# Patient Record
Sex: Male | Born: 2005 | Race: White | Hispanic: No | Marital: Single | Smoking: Never smoker
Health system: Southern US, Community
[De-identification: ages and names within clinical notes are randomized; demographics above are authoritative.]

## PROBLEM LIST (undated history)

## (undated) ENCOUNTER — Ambulatory Visit: Admission: EM | Payer: Managed Care, Other (non HMO) | Source: Home / Self Care

## (undated) DIAGNOSIS — K4090 Unilateral inguinal hernia, without obstruction or gangrene, not specified as recurrent: Secondary | ICD-10-CM

## (undated) DIAGNOSIS — J45909 Unspecified asthma, uncomplicated: Secondary | ICD-10-CM

## (undated) HISTORY — PX: HERNIA REPAIR: SHX51

---
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
## 2023-05-24 ENCOUNTER — Ambulatory Visit (INDEPENDENT_AMBULATORY_CARE_PROVIDER_SITE_OTHER): Payer: Managed Care, Other (non HMO) | Admitting: Podiatry

## 2023-05-24 ENCOUNTER — Encounter: Payer: Self-pay | Admitting: Podiatry

## 2023-05-24 DIAGNOSIS — L600 Ingrowing nail: Secondary | ICD-10-CM | POA: Diagnosis not present

## 2023-05-24 NOTE — Progress Notes (Signed)
Subjective:  Patient ID: Frederick Bass, male    DOB: 22-Aug-2006,  MRN: [id number]  Chief Complaint  Patient presents with   Ingrown Toenail    Right hallux ingrown     17 y.o. male presents with the above complaint.  Patient presents with right lateral border ingrown painful to touch is progressive gotten worse worse with ambulation is with pressure pain scale 7 out of 10 dull aching nature he would like to have removed he has not seen MRIs prior to seeing me.  Denies any other acute complaints   Review of Systems: Negative except as noted in the HPI. Denies N/V/F/Ch.  Past Medical History:  Diagnosis Date   Asthma    Hernia, inguinal     Current Outpatient Medications:    amitriptyline (ELAVIL) 10 MG tablet, Take 1 tablet by mouth at bedtime., Disp: , Rfl:    albuterol (PROVENTIL HFA;VENTOLIN HFA) 108 (90 BASE) MCG/ACT inhaler, Inhale 2 puffs into the lungs every 6 (six) hours as needed for wheezing or shortness of breath., Disp: 1 Inhaler, Rfl: 0   benzonatate (TESSALON) 100 MG capsule, Take 2 capsules (200 mg total) by mouth every 8 (eight) hours., Disp: 21 capsule, Rfl: 0   Cephalexin 500 MG tablet, cephalexin 500 mg tablet  Take 1 tablet every 6 hours by oral route for 5 days., Disp: , Rfl:    ibuprofen (ADVIL) 600 MG tablet, Take 1 tablet (600 mg total) by mouth every 8 (eight) hours as needed., Disp: 30 tablet, Rfl: 0   ipratropium (ATROVENT) 0.06 % nasal spray, Place 2 sprays into both nostrils 4 (four) times daily., Disp: 15 mL, Rfl: 12   promethazine-dextromethorphan (PROMETHAZINE-DM) 6.25-15 MG/5ML syrup, Take 5 mLs by mouth 4 (four) times daily as needed., Disp: 118 mL, Rfl: 0  Social History   Tobacco Use  Smoking Status Never  Smokeless Tobacco Never    No Known Allergies Objective:  There were no vitals filed for this visit. There is no height or weight on file to calculate BMI. Constitutional Well developed. Well nourished.  Vascular Dorsalis pedis pulses  palpable bilaterally. Posterior tibial pulses palpable bilaterally. Capillary refill normal to all digits.  No cyanosis or clubbing noted. Pedal hair growth normal.  Neurologic Normal speech. Oriented to person, place, and time. Epicritic sensation to light touch grossly present bilaterally.  Dermatologic Painful ingrowing nail at lateral nail borders of the hallux nail right. No other open wounds. No skin lesions.  Orthopedic: Normal joint ROM without pain or crepitus bilaterally. No visible deformities. No bony tenderness.   Radiographs: None Assessment:   1. Ingrown toenail of right foot    Plan:  Patient was evaluated and treated and all questions answered.  Ingrown Nail, right -Patient elects to proceed with minor surgery to remove ingrown toenail removal today. Consent reviewed and signed by patient. -Ingrown nail excised. See procedure note. -Educated on post-procedure care including soaking. Written instructions provided and reviewed. -Patient to follow up in 2 weeks for nail check.  Procedure: Excision of Ingrown Toenail Location: Right 1st toe lateral nail borders. Anesthesia: Lidocaine 1% plain; 1.5 mL and Marcaine 0.5% plain; 1.5 mL, digital block. Skin Prep: Betadine. Dressing: Silvadene; telfa; dry, sterile, compression dressing. Technique: Following skin prep, the toe was exsanguinated and a tourniquet was secured at the base of the toe. The affected nail border was freed, split with a nail splitter, and excised. Chemical matrixectomy was then performed with phenol and irrigated out with alcohol. The tourniquet was then  removed and sterile dressing applied. Disposition: Patient tolerated procedure well. Patient to return in 2 weeks for follow-up.   No follow-ups on file.

## 2024-02-08 ENCOUNTER — Other Ambulatory Visit: Payer: Self-pay | Admitting: Student

## 2024-02-08 DIAGNOSIS — R519 Headache, unspecified: Secondary | ICD-10-CM

## 2024-02-15 ENCOUNTER — Encounter: Payer: Self-pay | Admitting: Student

## 2024-02-17 ENCOUNTER — Other Ambulatory Visit

## 2024-02-21 ENCOUNTER — Ambulatory Visit
Admission: RE | Admit: 2024-02-21 | Discharge: 2024-02-21 | Disposition: A | Discharge status: Home / Self Care | Source: Ambulatory Visit | Attending: Student | Admitting: Student

## 2024-02-21 DIAGNOSIS — R519 Headache, unspecified: Secondary | ICD-10-CM
